# Patient Record
Sex: Female | Born: 1955 | Race: White | Hispanic: No | Marital: Married | Smoking: Never smoker
Health system: Southern US, Community
[De-identification: ages and names within clinical notes are randomized; demographics above are authoritative.]

## PROBLEM LIST (undated history)

## (undated) DIAGNOSIS — Z923 Personal history of irradiation: Secondary | ICD-10-CM

## (undated) DIAGNOSIS — C50919 Malignant neoplasm of unspecified site of unspecified female breast: Secondary | ICD-10-CM

## (undated) DIAGNOSIS — F419 Anxiety disorder, unspecified: Secondary | ICD-10-CM

## (undated) HISTORY — DX: Anxiety disorder, unspecified: F41.9

## (undated) HISTORY — PX: BREAST LUMPECTOMY: SHX2

## (undated) HISTORY — DX: Malignant neoplasm of unspecified site of unspecified female breast: C50.919

---
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
## 1997-10-16 ENCOUNTER — Other Ambulatory Visit: Admission: RE | Admit: 1997-10-16 | Discharge: 1997-10-16 | Payer: Self-pay | Admitting: Gynecology

## 1998-07-06 ENCOUNTER — Other Ambulatory Visit: Admission: RE | Admit: 1998-07-06 | Discharge: 1998-07-06 | Payer: Self-pay | Admitting: Gynecology

## 1998-11-02 ENCOUNTER — Other Ambulatory Visit: Admission: RE | Admit: 1998-11-02 | Discharge: 1998-11-02 | Payer: Self-pay | Admitting: Gynecology

## 1998-12-31 ENCOUNTER — Other Ambulatory Visit: Admission: RE | Admit: 1998-12-31 | Discharge: 1998-12-31 | Payer: Self-pay | Admitting: Gynecology

## 1998-12-31 ENCOUNTER — Encounter (INDEPENDENT_AMBULATORY_CARE_PROVIDER_SITE_OTHER): Payer: Self-pay

## 1999-01-28 ENCOUNTER — Encounter (INDEPENDENT_AMBULATORY_CARE_PROVIDER_SITE_OTHER): Payer: Self-pay | Admitting: Specialist

## 1999-01-28 ENCOUNTER — Ambulatory Visit (HOSPITAL_COMMUNITY): Admission: RE | Admit: 1999-01-28 | Discharge: 1999-01-28 | Payer: Self-pay | Admitting: Gynecology

## 1999-03-23 ENCOUNTER — Other Ambulatory Visit: Admission: RE | Admit: 1999-03-23 | Discharge: 1999-03-23 | Payer: Self-pay | Admitting: Gynecology

## 1999-05-31 ENCOUNTER — Other Ambulatory Visit: Admission: RE | Admit: 1999-05-31 | Discharge: 1999-05-31 | Payer: Self-pay | Admitting: Gynecology

## 1999-11-30 ENCOUNTER — Encounter: Payer: Self-pay | Admitting: Gynecology

## 1999-11-30 ENCOUNTER — Encounter: Admission: RE | Admit: 1999-11-30 | Discharge: 1999-11-30 | Payer: Self-pay | Admitting: Gynecology

## 1999-11-30 ENCOUNTER — Other Ambulatory Visit: Admission: RE | Admit: 1999-11-30 | Discharge: 1999-11-30 | Payer: Self-pay | Admitting: Gynecology

## 2000-07-10 ENCOUNTER — Other Ambulatory Visit: Admission: RE | Admit: 2000-07-10 | Discharge: 2000-07-10 | Payer: Self-pay | Admitting: Gynecology

## 2001-01-18 ENCOUNTER — Other Ambulatory Visit: Admission: RE | Admit: 2001-01-18 | Discharge: 2001-01-18 | Payer: Self-pay | Admitting: Gynecology

## 2001-02-05 ENCOUNTER — Encounter (INDEPENDENT_AMBULATORY_CARE_PROVIDER_SITE_OTHER): Payer: Self-pay | Admitting: Specialist

## 2001-02-05 ENCOUNTER — Other Ambulatory Visit: Admission: RE | Admit: 2001-02-05 | Discharge: 2001-02-05 | Payer: Self-pay | Admitting: Gynecology

## 2001-05-09 ENCOUNTER — Other Ambulatory Visit: Admission: RE | Admit: 2001-05-09 | Discharge: 2001-05-09 | Payer: Self-pay | Admitting: Gynecology

## 2001-11-06 ENCOUNTER — Other Ambulatory Visit: Admission: RE | Admit: 2001-11-06 | Discharge: 2001-11-06 | Payer: Self-pay | Admitting: Gynecology

## 2001-12-31 ENCOUNTER — Encounter: Admission: RE | Admit: 2001-12-31 | Discharge: 2001-12-31 | Payer: Self-pay | Admitting: Gynecology

## 2001-12-31 ENCOUNTER — Encounter: Payer: Self-pay | Admitting: Gynecology

## 2002-01-03 ENCOUNTER — Encounter: Admission: RE | Admit: 2002-01-03 | Discharge: 2002-01-03 | Payer: Self-pay | Admitting: Gynecology

## 2002-01-03 ENCOUNTER — Encounter: Payer: Self-pay | Admitting: Gynecology

## 2002-05-21 ENCOUNTER — Other Ambulatory Visit: Admission: RE | Admit: 2002-05-21 | Discharge: 2002-05-21 | Payer: Self-pay | Admitting: Gynecology

## 2003-01-06 ENCOUNTER — Encounter: Payer: Self-pay | Admitting: Gynecology

## 2003-01-06 ENCOUNTER — Encounter: Admission: RE | Admit: 2003-01-06 | Discharge: 2003-01-06 | Payer: Self-pay | Admitting: Gynecology

## 2003-01-06 ENCOUNTER — Other Ambulatory Visit: Admission: RE | Admit: 2003-01-06 | Discharge: 2003-01-06 | Payer: Self-pay | Admitting: Gynecology

## 2003-01-09 ENCOUNTER — Encounter (INDEPENDENT_AMBULATORY_CARE_PROVIDER_SITE_OTHER): Payer: Self-pay | Admitting: *Deleted

## 2003-01-09 ENCOUNTER — Encounter: Payer: Self-pay | Admitting: Gynecology

## 2003-01-09 ENCOUNTER — Encounter: Admission: RE | Admit: 2003-01-09 | Discharge: 2003-01-09 | Payer: Self-pay | Admitting: Gynecology

## 2003-01-15 ENCOUNTER — Encounter: Payer: Self-pay | Admitting: General Surgery

## 2003-01-15 ENCOUNTER — Ambulatory Visit (HOSPITAL_COMMUNITY): Admission: RE | Admit: 2003-01-15 | Discharge: 2003-01-15 | Payer: Self-pay | Admitting: General Surgery

## 2003-01-16 ENCOUNTER — Encounter: Admission: RE | Admit: 2003-01-16 | Discharge: 2003-01-16 | Payer: Self-pay | Admitting: General Surgery

## 2003-01-16 ENCOUNTER — Ambulatory Visit (HOSPITAL_COMMUNITY): Admission: RE | Admit: 2003-01-16 | Discharge: 2003-01-16 | Payer: Self-pay | Admitting: General Surgery

## 2003-01-16 ENCOUNTER — Encounter: Payer: Self-pay | Admitting: General Surgery

## 2003-01-21 ENCOUNTER — Ambulatory Visit (HOSPITAL_BASED_OUTPATIENT_CLINIC_OR_DEPARTMENT_OTHER): Admission: RE | Admit: 2003-01-21 | Discharge: 2003-01-21 | Payer: Self-pay | Admitting: General Surgery

## 2003-01-21 ENCOUNTER — Encounter: Admission: RE | Admit: 2003-01-21 | Discharge: 2003-01-21 | Payer: Self-pay | Admitting: General Surgery

## 2003-01-21 ENCOUNTER — Encounter: Payer: Self-pay | Admitting: General Surgery

## 2003-01-21 ENCOUNTER — Encounter (INDEPENDENT_AMBULATORY_CARE_PROVIDER_SITE_OTHER): Payer: Self-pay | Admitting: Specialist

## 2003-01-28 ENCOUNTER — Ambulatory Visit: Admission: RE | Admit: 2003-01-28 | Discharge: 2003-04-25 | Payer: Self-pay | Admitting: Radiation Oncology

## 2003-05-23 ENCOUNTER — Ambulatory Visit: Admission: RE | Admit: 2003-05-23 | Discharge: 2003-05-23 | Payer: Self-pay | Admitting: Radiation Oncology

## 2003-07-07 ENCOUNTER — Ambulatory Visit (HOSPITAL_COMMUNITY): Admission: RE | Admit: 2003-07-07 | Discharge: 2003-07-07 | Payer: Self-pay | Admitting: Oncology

## 2003-09-04 ENCOUNTER — Encounter: Admission: RE | Admit: 2003-09-04 | Discharge: 2003-09-04 | Payer: Self-pay | Admitting: General Surgery

## 2003-10-21 ENCOUNTER — Ambulatory Visit: Admission: RE | Admit: 2003-10-21 | Discharge: 2003-10-21 | Payer: Self-pay

## 2003-11-21 ENCOUNTER — Ambulatory Visit (HOSPITAL_COMMUNITY): Admission: RE | Admit: 2003-11-21 | Discharge: 2003-11-21 | Payer: Self-pay | Admitting: Oncology

## 2004-01-07 ENCOUNTER — Other Ambulatory Visit: Admission: RE | Admit: 2004-01-07 | Discharge: 2004-01-07 | Payer: Self-pay | Admitting: Gynecology

## 2004-01-07 ENCOUNTER — Encounter: Admission: RE | Admit: 2004-01-07 | Discharge: 2004-01-07 | Payer: Self-pay | Admitting: Gynecology

## 2004-05-21 ENCOUNTER — Ambulatory Visit: Payer: Self-pay | Admitting: Oncology

## 2004-06-15 ENCOUNTER — Other Ambulatory Visit: Admission: RE | Admit: 2004-06-15 | Discharge: 2004-06-15 | Payer: Self-pay | Admitting: Gynecology

## 2004-11-18 ENCOUNTER — Ambulatory Visit: Payer: Self-pay | Admitting: Oncology

## 2004-11-19 ENCOUNTER — Ambulatory Visit (HOSPITAL_COMMUNITY): Admission: RE | Admit: 2004-11-19 | Discharge: 2004-11-19 | Payer: Self-pay | Admitting: Oncology

## 2004-12-30 ENCOUNTER — Other Ambulatory Visit: Admission: RE | Admit: 2004-12-30 | Discharge: 2004-12-30 | Payer: Self-pay | Admitting: Gynecology

## 2005-01-19 ENCOUNTER — Encounter: Admission: RE | Admit: 2005-01-19 | Discharge: 2005-01-19 | Payer: Self-pay | Admitting: Gynecology

## 2005-04-22 ENCOUNTER — Encounter: Admission: RE | Admit: 2005-04-22 | Discharge: 2005-04-22 | Payer: Self-pay | Admitting: Gynecology

## 2005-05-26 ENCOUNTER — Ambulatory Visit: Payer: Self-pay | Admitting: Oncology

## 2005-07-19 ENCOUNTER — Other Ambulatory Visit: Admission: RE | Admit: 2005-07-19 | Discharge: 2005-07-19 | Payer: Self-pay | Admitting: Gynecology

## 2005-11-23 ENCOUNTER — Ambulatory Visit: Payer: Self-pay | Admitting: Oncology

## 2005-11-25 ENCOUNTER — Ambulatory Visit (HOSPITAL_COMMUNITY): Admission: RE | Admit: 2005-11-25 | Discharge: 2005-11-25 | Payer: Self-pay | Admitting: Oncology

## 2005-11-25 LAB — COMPREHENSIVE METABOLIC PANEL
ALT: 9 U/L (ref 0–40)
AST: 15 U/L (ref 0–37)
BUN: 10 mg/dL (ref 6–23)
Calcium: 9 mg/dL (ref 8.4–10.5)
Chloride: 103 mEq/L (ref 96–112)
Creatinine, Ser: 0.9 mg/dL (ref 0.4–1.2)
Total Bilirubin: 0.5 mg/dL (ref 0.3–1.2)

## 2005-11-25 LAB — LACTATE DEHYDROGENASE: LDH: 142 U/L (ref 94–250)

## 2005-11-25 LAB — CBC WITH DIFFERENTIAL/PLATELET
BASO%: 0.6 % (ref 0.0–2.0)
Basophils Absolute: 0 10*3/uL (ref 0.0–0.1)
EOS%: 1.7 % (ref 0.0–7.0)
HCT: 41.3 % (ref 34.8–46.6)
HGB: 14.1 g/dL (ref 11.6–15.9)
LYMPH%: 23.4 % (ref 14.0–48.0)
MCH: 29.9 pg (ref 26.0–34.0)
MCHC: 34.1 g/dL (ref 32.0–36.0)
MCV: 87.7 fL (ref 81.0–101.0)
NEUT%: 66.1 % (ref 39.6–76.8)
Platelets: 327 10*3/uL (ref 145–400)

## 2006-01-27 ENCOUNTER — Encounter: Admission: RE | Admit: 2006-01-27 | Discharge: 2006-01-27 | Payer: Self-pay | Admitting: Gynecology

## 2006-02-07 ENCOUNTER — Other Ambulatory Visit: Admission: RE | Admit: 2006-02-07 | Discharge: 2006-02-07 | Payer: Self-pay | Admitting: Gynecology

## 2006-05-24 ENCOUNTER — Ambulatory Visit: Payer: Self-pay | Admitting: Oncology

## 2006-05-26 LAB — COMPREHENSIVE METABOLIC PANEL
AST: 13 U/L (ref 0–37)
Alkaline Phosphatase: 48 U/L (ref 39–117)
BUN: 14 mg/dL (ref 6–23)
Creatinine, Ser: 0.9 mg/dL (ref 0.40–1.20)
Potassium: 3.9 mEq/L (ref 3.5–5.3)

## 2006-05-26 LAB — CBC WITH DIFFERENTIAL/PLATELET
Basophils Absolute: 0 10*3/uL (ref 0.0–0.1)
EOS%: 1.5 % (ref 0.0–7.0)
HGB: 14.1 g/dL (ref 11.6–15.9)
MCH: 30.3 pg (ref 26.0–34.0)
MCV: 88.7 fL (ref 81.0–101.0)
MONO%: 7.4 % (ref 0.0–13.0)
NEUT%: 67.3 % (ref 39.6–76.8)
RDW: 13.2 % (ref 11.3–14.5)

## 2006-11-22 ENCOUNTER — Ambulatory Visit: Payer: Self-pay | Admitting: Oncology

## 2006-11-24 ENCOUNTER — Ambulatory Visit (HOSPITAL_COMMUNITY): Admission: RE | Admit: 2006-11-24 | Discharge: 2006-11-24 | Payer: Self-pay | Admitting: Oncology

## 2006-11-24 LAB — COMPREHENSIVE METABOLIC PANEL
Albumin: 4.4 g/dL (ref 3.5–5.2)
BUN: 13 mg/dL (ref 6–23)
Calcium: 9.2 mg/dL (ref 8.4–10.5)
Chloride: 102 mEq/L (ref 96–112)
Creatinine, Ser: 0.85 mg/dL (ref 0.40–1.20)
Glucose, Bld: 91 mg/dL (ref 70–99)
Potassium: 3.9 mEq/L (ref 3.5–5.3)

## 2006-11-24 LAB — CBC WITH DIFFERENTIAL/PLATELET
Basophils Absolute: 0 10*3/uL (ref 0.0–0.1)
EOS%: 1.8 % (ref 0.0–7.0)
Eosinophils Absolute: 0.1 10*3/uL (ref 0.0–0.5)
HCT: 40.2 % (ref 34.8–46.6)
HGB: 14.1 g/dL (ref 11.6–15.9)
MCH: 30.2 pg (ref 26.0–34.0)
MCV: 86.4 fL (ref 81.0–101.0)
NEUT#: 4.8 10*3/uL (ref 1.5–6.5)
NEUT%: 62.3 % (ref 39.6–76.8)
RDW: 12.8 % (ref 11.3–14.5)
lymph#: 2.1 10*3/uL (ref 0.9–3.3)

## 2006-11-24 LAB — LACTATE DEHYDROGENASE: LDH: 145 U/L (ref 94–250)

## 2007-02-19 ENCOUNTER — Other Ambulatory Visit: Admission: RE | Admit: 2007-02-19 | Discharge: 2007-02-19 | Payer: Self-pay | Admitting: Gynecology

## 2007-03-09 ENCOUNTER — Encounter: Admission: RE | Admit: 2007-03-09 | Discharge: 2007-03-09 | Payer: Self-pay | Admitting: Gynecology

## 2007-05-23 ENCOUNTER — Ambulatory Visit: Payer: Self-pay | Admitting: Oncology

## 2007-05-25 LAB — CBC WITH DIFFERENTIAL/PLATELET
Basophils Absolute: 0 10*3/uL (ref 0.0–0.1)
Eosinophils Absolute: 0.1 10*3/uL (ref 0.0–0.5)
HCT: 39.8 % (ref 34.8–46.6)
HGB: 14 g/dL (ref 11.6–15.9)
MCH: 30.9 pg (ref 26.0–34.0)
MONO#: 0.5 10*3/uL (ref 0.1–0.9)
NEUT%: 63.9 % (ref 39.6–76.8)
WBC: 6.1 10*3/uL (ref 3.9–10.0)
lymph#: 1.6 10*3/uL (ref 0.9–3.3)

## 2007-05-25 LAB — COMPREHENSIVE METABOLIC PANEL
BUN: 10 mg/dL (ref 6–23)
CO2: 25 mEq/L (ref 19–32)
Calcium: 9.2 mg/dL (ref 8.4–10.5)
Chloride: 104 mEq/L (ref 96–112)
Creatinine, Ser: 0.82 mg/dL (ref 0.40–1.20)
Glucose, Bld: 83 mg/dL (ref 70–99)

## 2007-05-25 LAB — LACTATE DEHYDROGENASE: LDH: 147 U/L (ref 94–250)

## 2007-05-31 ENCOUNTER — Ambulatory Visit (HOSPITAL_COMMUNITY): Admission: RE | Admit: 2007-05-31 | Discharge: 2007-05-31 | Payer: Self-pay | Admitting: Oncology

## 2007-11-20 ENCOUNTER — Ambulatory Visit: Payer: Self-pay | Admitting: Oncology

## 2007-12-28 LAB — CBC WITH DIFFERENTIAL/PLATELET
Basophils Absolute: 0 10*3/uL (ref 0.0–0.1)
EOS%: 2.3 % (ref 0.0–7.0)
HCT: 40.8 % (ref 34.8–46.6)
HGB: 14.1 g/dL (ref 11.6–15.9)
MCH: 30.3 pg (ref 26.0–34.0)
NEUT%: 65.1 % (ref 39.6–76.8)
lymph#: 1.8 10*3/uL (ref 0.9–3.3)

## 2007-12-28 LAB — COMPREHENSIVE METABOLIC PANEL
ALT: 16 U/L (ref 0–35)
AST: 18 U/L (ref 0–37)
Albumin: 4.7 g/dL (ref 3.5–5.2)
Calcium: 9.4 mg/dL (ref 8.4–10.5)
Chloride: 105 mEq/L (ref 96–112)
Creatinine, Ser: 1 mg/dL (ref 0.40–1.20)
Potassium: 4.2 mEq/L (ref 3.5–5.3)
Sodium: 141 mEq/L (ref 135–145)
Total Protein: 7.4 g/dL (ref 6.0–8.3)

## 2008-01-03 ENCOUNTER — Ambulatory Visit (HOSPITAL_COMMUNITY): Admission: RE | Admit: 2008-01-03 | Discharge: 2008-01-03 | Payer: Self-pay | Admitting: Oncology

## 2008-03-28 ENCOUNTER — Encounter: Admission: RE | Admit: 2008-03-28 | Discharge: 2008-03-28 | Payer: Self-pay | Admitting: Gynecology

## 2008-06-26 ENCOUNTER — Ambulatory Visit: Payer: Self-pay | Admitting: Oncology

## 2008-06-30 LAB — CBC WITH DIFFERENTIAL/PLATELET
BASO%: 0.5 % (ref 0.0–2.0)
EOS%: 2.5 % (ref 0.0–7.0)
HCT: 40 % (ref 34.8–46.6)
LYMPH%: 24.9 % (ref 14.0–48.0)
MCH: 30.9 pg (ref 26.0–34.0)
MCHC: 34.2 g/dL (ref 32.0–36.0)
MCV: 90.5 fL (ref 81.0–101.0)
MONO%: 7.5 % (ref 0.0–13.0)
NEUT%: 64.6 % (ref 39.6–76.8)
Platelets: 289 10*3/uL (ref 145–400)

## 2008-06-30 LAB — COMPREHENSIVE METABOLIC PANEL
ALT: <8 U/L (ref 0–35)
AST: 10 U/L (ref 0–37)
Alkaline Phosphatase: 49 U/L (ref 39–117)
CO2: 23 mEq/L (ref 19–32)
Creatinine, Ser: 0.83 mg/dL (ref 0.40–1.20)
Total Bilirubin: 0.4 mg/dL (ref 0.3–1.2)

## 2008-06-30 LAB — LACTATE DEHYDROGENASE: LDH: 155 U/L (ref 94–250)

## 2009-04-30 ENCOUNTER — Encounter: Admission: RE | Admit: 2009-04-30 | Discharge: 2009-04-30 | Payer: Self-pay | Admitting: Gynecology

## 2009-05-21 ENCOUNTER — Ambulatory Visit: Payer: Self-pay | Admitting: Oncology

## 2009-08-05 ENCOUNTER — Ambulatory Visit: Payer: Self-pay | Admitting: Oncology

## 2009-08-07 LAB — CBC WITH DIFFERENTIAL/PLATELET
Basophils Absolute: 0 10*3/uL (ref 0.0–0.1)
Eosinophils Absolute: 0.1 10*3/uL (ref 0.0–0.5)
HCT: 40.6 % (ref 34.8–46.6)
LYMPH%: 23.1 % (ref 14.0–49.7)
MCV: 90.8 fL (ref 79.5–101.0)
MONO#: 0.6 10*3/uL (ref 0.1–0.9)
MONO%: 7.6 % (ref 0.0–14.0)
NEUT#: 5.7 10*3/uL (ref 1.5–6.5)
NEUT%: 67.3 % (ref 38.4–76.8)
Platelets: 314 10*3/uL (ref 145–400)
RBC: 4.47 10*6/uL (ref 3.70–5.45)
WBC: 8.4 10*3/uL (ref 3.9–10.3)

## 2009-08-07 LAB — COMPREHENSIVE METABOLIC PANEL
Alkaline Phosphatase: 53 U/L (ref 39–117)
BUN: 10 mg/dL (ref 6–23)
CO2: 25 mEq/L (ref 19–32)
Creatinine, Ser: 0.91 mg/dL (ref 0.40–1.20)
Glucose, Bld: 83 mg/dL (ref 70–99)
Sodium: 138 mEq/L (ref 135–145)
Total Bilirubin: 0.6 mg/dL (ref 0.3–1.2)
Total Protein: 7.2 g/dL (ref 6.0–8.3)

## 2009-08-07 LAB — LACTATE DEHYDROGENASE: LDH: 166 U/L (ref 94–250)

## 2010-05-04 ENCOUNTER — Encounter: Admission: RE | Admit: 2010-05-04 | Discharge: 2010-05-04 | Payer: Self-pay | Admitting: Oncology

## 2010-08-04 ENCOUNTER — Ambulatory Visit: Payer: Self-pay | Admitting: Oncology

## 2010-08-06 ENCOUNTER — Ambulatory Visit (HOSPITAL_COMMUNITY)
Admission: RE | Admit: 2010-08-06 | Discharge: 2010-08-06 | Payer: Self-pay | Source: Home / Self Care | Attending: Oncology | Admitting: Oncology

## 2010-08-06 LAB — CBC WITH DIFFERENTIAL/PLATELET
Basophils Absolute: 0 10*3/uL (ref 0.0–0.1)
EOS%: 1.7 % (ref 0.0–7.0)
LYMPH%: 28.2 % (ref 14.0–49.7)
MCH: 30.7 pg (ref 25.1–34.0)
MCV: 89.4 fL (ref 79.5–101.0)
MONO%: 7.6 % (ref 0.0–14.0)
Platelets: 298 10*3/uL (ref 145–400)
RBC: 4.66 10*6/uL (ref 3.70–5.45)
RDW: 13.2 % (ref 11.2–14.5)

## 2010-08-06 LAB — COMPREHENSIVE METABOLIC PANEL
AST: 18 U/L (ref 0–37)
Albumin: 4.6 g/dL (ref 3.5–5.2)
Alkaline Phosphatase: 54 U/L (ref 39–117)
BUN: 15 mg/dL (ref 6–23)
Potassium: 3.9 mEq/L (ref 3.5–5.3)
Sodium: 140 mEq/L (ref 135–145)
Total Bilirubin: 0.5 mg/dL (ref 0.3–1.2)

## 2010-09-02 ENCOUNTER — Other Ambulatory Visit: Payer: Self-pay | Admitting: Gynecology

## 2010-11-26 NOTE — Op Note (Signed)
NAME:  Caitlin Buchanan, Caitlin Buchanan                        ACCOUNT NO.:  [id number]   MEDICAL RECORD NO.:  [id number]                   PATIENT TYPE:  OUT   LOCATION:  MRI                                  FACILITY:  [facility]   PHYSICIAN:  Rose Phi. Maple Hudson, M.D.                DATE OF BIRTH:  Apr 23, 1956   DATE OF PROCEDURE:  01/21/2003  DATE OF DISCHARGE:                                 OPERATIVE REPORT   PREOPERATIVE DIAGNOSIS:  Stage I carcinoma of the right breast.   POSTOPERATIVE DIAGNOSIS:  Stage I carcinoma of the right breast.   OPERATION PERFORMED:  1. Blue dye injection.  2. Right partial mastectomy with needle localization and specimen     mammography.  3. Right sentinel lymph node biopsy.   SURGEON:  Rose Phi. Maple Hudson, M.D.   ANESTHESIA:  General.   DESCRIPTION OF PROCEDURE:  After suitable general anesthesia was induced,  the patient was placed in supine position with the right arm extended on the  arm board.  Prior to coming to the operating room, 1 mCi of technetium  sulfur colloid was injected intradermally.  After anesthetic had been  induced, I injected a mixture of 2mL of methylene blue and 3mL of injectable  saline in subareolar tissue.   We then prepped and draped the breast.  The nodule that was malignant was in  the 3 o'clock position of her right breast. A curved incision was then made  using the previously placed wire as a guide and a wide excision of the wire  and surrounding tissue was carried out.  The specimen was then oriented for  the pathologist with clips.  Ultrasound showed it to be in the lesion and  Touch Prep ultimately showed the margins to be clean.   While that was being done, a short incision was made in the right axilla  with dissection down through the subcutaneous tissue to the clavipectoral  fascia.  Just deep to that fascia was a blue and hot lymph nodes with counts  in excess of 2000.  I excised the lymph node.  Clips were used for the  lymphatics.  That was then submitted for Touch Preps.  There were no other  hot, blue or palpable nodes.  The incisions were then closed with 3-0 Vicryl  and subcuticular 4-0 Monocryl and Steri-Strips.   Touch Preps on the margins were clean and the sentinel node was negative.  Dressings were applied.  The patient was then transferred to the recovery  room in satisfactory condition having tolerated the procedure well.                                               Rose Phi. Maple Hudson, M.D.    PRY/MEDQ  D:  01/21/2003  T:  01/21/2003  Job:  [id number]   cc:   [name], M.D.  [address] [place]  [place] [address]  Fax: [phone]

## 2011-04-26 ENCOUNTER — Other Ambulatory Visit: Payer: Self-pay | Admitting: Gynecology

## 2011-04-26 DIAGNOSIS — Z1231 Encounter for screening mammogram for malignant neoplasm of breast: Secondary | ICD-10-CM

## 2011-05-12 ENCOUNTER — Ambulatory Visit: Payer: Self-pay

## 2011-05-24 ENCOUNTER — Other Ambulatory Visit: Payer: Self-pay | Admitting: Gynecology

## 2011-06-13 ENCOUNTER — Other Ambulatory Visit: Payer: Self-pay | Admitting: Gynecology

## 2011-06-13 ENCOUNTER — Ambulatory Visit
Admission: RE | Admit: 2011-06-13 | Discharge: 2011-06-13 | Disposition: A | Discharge status: Home / Self Care | Payer: BC Managed Care – PPO | Source: Ambulatory Visit | Attending: Gynecology | Admitting: Gynecology

## 2011-06-13 DIAGNOSIS — N644 Mastodynia: Secondary | ICD-10-CM

## 2011-06-13 DIAGNOSIS — Z1231 Encounter for screening mammogram for malignant neoplasm of breast: Secondary | ICD-10-CM

## 2011-06-13 DIAGNOSIS — N6310 Unspecified lump in the right breast, unspecified quadrant: Secondary | ICD-10-CM

## 2011-06-27 ENCOUNTER — Ambulatory Visit
Admission: RE | Admit: 2011-06-27 | Discharge: 2011-06-27 | Disposition: A | Discharge status: Home / Self Care | Payer: BC Managed Care – PPO | Source: Ambulatory Visit | Attending: Gynecology | Admitting: Gynecology

## 2011-06-27 DIAGNOSIS — N644 Mastodynia: Secondary | ICD-10-CM

## 2011-06-27 DIAGNOSIS — N6310 Unspecified lump in the right breast, unspecified quadrant: Secondary | ICD-10-CM

## 2011-08-09 ENCOUNTER — Telehealth: Payer: Self-pay | Admitting: Oncology

## 2011-08-09 NOTE — Telephone Encounter (Signed)
pt had called to r/s 1/31appt,r/s to 3/14 and i l/m w/ robin as pt req to have chol ck but she may do this wth her gyn   aom

## 2011-08-11 ENCOUNTER — Ambulatory Visit: Payer: BC Managed Care – PPO | Admitting: Physician Assistant

## 2011-08-11 ENCOUNTER — Other Ambulatory Visit: Payer: BC Managed Care – PPO | Admitting: Lab

## 2011-09-20 ENCOUNTER — Telehealth: Payer: Self-pay | Admitting: Oncology

## 2011-09-20 NOTE — Telephone Encounter (Signed)
R/s 3/14 appt to 3/22 w/RJ due to SW out of office. S/w pt today she is aware of change and new d/t.

## 2011-09-22 ENCOUNTER — Ambulatory Visit: Payer: BC Managed Care – PPO | Admitting: Physician Assistant

## 2011-09-28 ENCOUNTER — Telehealth: Payer: Self-pay | Admitting: Oncology

## 2011-09-28 NOTE — Telephone Encounter (Signed)
pt had called to r/s 3/22 appt,l/m for her to c/b  aom

## 2011-09-30 ENCOUNTER — Ambulatory Visit: Payer: BC Managed Care – PPO | Admitting: Physician Assistant

## 2011-12-26 ENCOUNTER — Telehealth: Payer: Self-pay | Admitting: Oncology

## 2011-12-26 NOTE — Telephone Encounter (Signed)
pt called to r/s appts aom

## 2012-01-05 ENCOUNTER — Other Ambulatory Visit: Payer: Self-pay | Admitting: Gynecology

## 2012-01-30 ENCOUNTER — Other Ambulatory Visit: Payer: Self-pay

## 2012-01-30 DIAGNOSIS — C50919 Malignant neoplasm of unspecified site of unspecified female breast: Secondary | ICD-10-CM

## 2012-01-31 ENCOUNTER — Ambulatory Visit (HOSPITAL_BASED_OUTPATIENT_CLINIC_OR_DEPARTMENT_OTHER): Payer: BC Managed Care – PPO | Admitting: Oncology

## 2012-01-31 ENCOUNTER — Telehealth: Payer: Self-pay | Admitting: Medical Oncology

## 2012-01-31 ENCOUNTER — Encounter: Payer: Self-pay | Admitting: Oncology

## 2012-01-31 ENCOUNTER — Other Ambulatory Visit (HOSPITAL_BASED_OUTPATIENT_CLINIC_OR_DEPARTMENT_OTHER): Payer: BC Managed Care – PPO | Admitting: Lab

## 2012-01-31 VITALS — BP 129/85 | HR 87 | Temp 99.2°F | Ht 68.0 in | Wt 206.0 lb

## 2012-01-31 DIAGNOSIS — C50911 Malignant neoplasm of unspecified site of right female breast: Secondary | ICD-10-CM

## 2012-01-31 DIAGNOSIS — C50919 Malignant neoplasm of unspecified site of unspecified female breast: Secondary | ICD-10-CM

## 2012-01-31 LAB — CBC WITH DIFFERENTIAL/PLATELET
Basophils Absolute: 0 10*3/uL (ref 0.0–0.1)
Eosinophils Absolute: 0.2 10*3/uL (ref 0.0–0.5)
HGB: 13.8 g/dL (ref 11.6–15.9)
MCV: 88.8 fL (ref 79.5–101.0)
MONO#: 0.6 10*3/uL (ref 0.1–0.9)
MONO%: 7.9 % (ref 0.0–14.0)
NEUT#: 4.6 10*3/uL (ref 1.5–6.5)
RBC: 4.66 10*6/uL (ref 3.70–5.45)
RDW: 13.3 % (ref 11.2–14.5)
WBC: 7.4 10*3/uL (ref 3.9–10.3)
lymph#: 2 10*3/uL (ref 0.9–3.3)

## 2012-01-31 LAB — COMPREHENSIVE METABOLIC PANEL
Albumin: 4.3 g/dL (ref 3.5–5.2)
Alkaline Phosphatase: 81 U/L (ref 39–117)
BUN: 15 mg/dL (ref 6–23)
CO2: 29 mEq/L (ref 19–32)
Calcium: 10 mg/dL (ref 8.4–10.5)
Chloride: 102 mEq/L (ref 96–112)
Glucose, Bld: 97 mg/dL (ref 70–99)
Potassium: 3.9 mEq/L (ref 3.5–5.3)
Sodium: 139 mEq/L (ref 135–145)
Total Protein: 7.2 g/dL (ref 6.0–8.3)

## 2012-01-31 NOTE — Progress Notes (Signed)
This office note has been dictated.  #[id number]

## 2012-01-31 NOTE — Progress Notes (Signed)
CC:   Caitlin Buchanan, Ph.D., M.D. Caitlin Buchanan, M.D.  PROBLEM LIST: 1. 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     Estrogen receptor 17%, progesterone receptor 73%.  Negative HER-     2/neu, S-phase fraction 5% which is low, treated with partial     mastectomy, sentinel lymph node dissection on 01/21/2003 and     radiation treatments given from 03/05/2003 through 04/22/2003 under     the direction of Dr. Antony Buchanan, total of 6240 cGy.  The patient     received adjuvant tamoxifen from mid October 2004 through mid May     2006.  Tamoxifen was discontinued because of weight gain and hot     flashes.  The patient has remained off treatment and disease free     since that time. 2. Dyslipidemia diagnosed December 2012. 3. Hypothyroidism diagnosed December 2012. 4. Detached retina of right eye late January 2013 status post     treatment, now resolved. 5. Anxiety since January 2004.  MEDICATIONS: 1. Lipitor 20 mg daily. 2. Clonidine 0.2 mg p.o. at bedtime for hot flashes. 3. Ergocalciferol 1000 mg daily. 4. Lexapro 10 mg daily. 5. Levothyroxine 75 mcg daily. 6. Multivitamins daily. 7. Desyrel 100 mg at bedtime.  HISTORY:  Caitlin Buchanan was seen today for followup of her stage I, grade 1, tubular carcinoma of the right breast with positive hormone receptors and negative HER-2/neu dating back to June of 2004, currently off of all treatment and disease free.  The patient denies any problems related to her breast cancer.  She has remained generally healthy.  She had a chest x-ray on 08/06/2010 at the time of her last visit which was negative.  She has had diagnostic bilateral mammograms and a right breast ultrasound carried out on 06/27/2011 which were negative.  No abnormality was seen at the 7 o'clock position where there was a palpable abnormality felt apparently by the patient and Dr. Nicholas Buchanan.   The patient seems to be doing well at this time.  She tells me that she continues to live in [place].  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  PHYSICAL EXAMINATION:  The patient looks well.  She has gained considerable weight over the years.  Her weight today is 206 pounds as compared with a weight of 192 pounds back in January 2012.  If we go back to July of 2004, the patient weighed about 160 pounds.  Weight gain has been more less gradual over the years.  Height 5 feet 8 inches, body surface area 2.12 sq m.  Blood pressure 129/85.  Other vital signs are normal.  Temperature is 99.2.  There is no scleral icterus.  Mouth and pharynx are benign.  There is no peripheral adenopathy palpable.  No axillary adenopathy.  Heart and lungs are normal.  Left breast is benign.  Right breast is slightly smaller than the left breast with excellent cosmetic result.  There is a curvilinear scar medial to the nipple-areolar complex which is well healed without any suspicious findings.  There was a palpable nodule or irregular tissue at about the 7 o'clock position adjacent to the areola.  Abdomen:  Benign with no organomegaly or masses palpable.  Extremities:  Puffy without pitting edema or clubbing.  No lymphedema of the right arm.  Neurologic:  Exam is normal.  By  history, the patient says that she has been having some swelling of her lower legs and ankles.  That is not evident today.  LABORATORY DATA:  Today, white count 7.4, ANC 4.6, hemoglobin 13.8, hematocrit 41.4, platelets 258,000.  Chemistries today were entirely normal.  BUN 15, creatinine 0.99.  IMAGING STUDIES: 1. Chest x-ray, 2 view, from 08/06/2010 was negative. 2. Digital diagnostic bilateral mammogram and right breast ultrasound     on 06/27/2011 showed no evidence for malignancy in either breast.     Specifically, there was no abnormality seen in the right breast at     the 7 o'clock  position.  IMPRESSION AND PLAN:  Caitlin Buchanan continues to do well with no signs of recurrent breast cancer now 9 years from the time of diagnosis.  The breast cancer had very favorable characteristics.  The patient asked me about using vaginal estrogen cream.  I see no contraindication to the use of vaginal estrogen cream at this point in time, especially if there is appropriate indication.  Clearly, the patient needs to continue with her yearly mammograms.  In looking over her chart, her last colonoscopy was carried out on 06/16/2008.  I believe this was in [facility] and was negative.  The patient tells me that her maternal uncle had colon cancer and is being recommended that her colonoscopy be repeated in the latter part of 2014, at a 5-year interval.  I think this is quite reasonable.  I told the patient that she did not need ongoing followup through our office, that she could clearly be followed by Dr. Nicholas Buchanan who is managing her primary care needs at this time.  Certainly, I would be more than happy to see Caitlin Buchanan again should any questions or problems arise in the future.    ______________________________ Samul Dada, M.D. DSM/MEDQ  D:  01/31/2012  T:  01/31/2012  Job:  [id number]

## 2012-02-02 NOTE — Telephone Encounter (Signed)
Opened in error

## 2012-07-12 ENCOUNTER — Other Ambulatory Visit: Payer: Self-pay | Admitting: Gynecology

## 2012-07-12 DIAGNOSIS — Z1231 Encounter for screening mammogram for malignant neoplasm of breast: Secondary | ICD-10-CM

## 2012-08-01 ENCOUNTER — Ambulatory Visit
Admission: RE | Admit: 2012-08-01 | Discharge: 2012-08-01 | Disposition: A | Discharge status: Home / Self Care | Payer: BC Managed Care – PPO | Source: Ambulatory Visit | Attending: Gynecology | Admitting: Gynecology

## 2012-08-01 DIAGNOSIS — Z1231 Encounter for screening mammogram for malignant neoplasm of breast: Secondary | ICD-10-CM

## 2013-07-22 ENCOUNTER — Other Ambulatory Visit: Payer: Self-pay

## 2013-07-22 DIAGNOSIS — Z1231 Encounter for screening mammogram for malignant neoplasm of breast: Secondary | ICD-10-CM

## 2013-07-22 DIAGNOSIS — Z853 Personal history of malignant neoplasm of breast: Secondary | ICD-10-CM

## 2013-07-22 DIAGNOSIS — Z9889 Other specified postprocedural states: Secondary | ICD-10-CM

## 2013-08-20 ENCOUNTER — Ambulatory Visit: Payer: BC Managed Care – PPO

## 2013-08-26 ENCOUNTER — Ambulatory Visit
Admission: RE | Admit: 2013-08-26 | Discharge: 2013-08-26 | Disposition: A | Discharge status: Home / Self Care | Payer: BC Managed Care – PPO | Source: Ambulatory Visit

## 2013-08-26 DIAGNOSIS — Z9889 Other specified postprocedural states: Secondary | ICD-10-CM

## 2013-08-26 DIAGNOSIS — Z853 Personal history of malignant neoplasm of breast: Secondary | ICD-10-CM

## 2013-08-26 DIAGNOSIS — Z1231 Encounter for screening mammogram for malignant neoplasm of breast: Secondary | ICD-10-CM

## 2013-09-02 ENCOUNTER — Other Ambulatory Visit: Payer: Self-pay | Admitting: Obstetrics and Gynecology

## 2013-09-02 DIAGNOSIS — R928 Other abnormal and inconclusive findings on diagnostic imaging of breast: Secondary | ICD-10-CM

## 2013-09-09 ENCOUNTER — Ambulatory Visit
Admission: RE | Admit: 2013-09-09 | Discharge: 2013-09-09 | Disposition: A | Discharge status: Home / Self Care | Payer: BC Managed Care – PPO | Source: Ambulatory Visit | Attending: Obstetrics and Gynecology | Admitting: Obstetrics and Gynecology

## 2013-09-09 ENCOUNTER — Other Ambulatory Visit: Payer: Self-pay | Admitting: Obstetrics and Gynecology

## 2013-09-09 DIAGNOSIS — R928 Other abnormal and inconclusive findings on diagnostic imaging of breast: Secondary | ICD-10-CM

## 2014-10-28 ENCOUNTER — Other Ambulatory Visit: Payer: Self-pay

## 2014-10-28 DIAGNOSIS — Z1231 Encounter for screening mammogram for malignant neoplasm of breast: Secondary | ICD-10-CM

## 2014-11-12 ENCOUNTER — Ambulatory Visit: Payer: BC Managed Care – PPO

## 2014-11-27 ENCOUNTER — Ambulatory Visit
Admission: RE | Admit: 2014-11-27 | Discharge: 2014-11-27 | Disposition: A | Discharge status: Home / Self Care | Payer: BC Managed Care – PPO | Source: Ambulatory Visit

## 2014-11-27 DIAGNOSIS — Z1231 Encounter for screening mammogram for malignant neoplasm of breast: Secondary | ICD-10-CM

## 2015-12-03 ENCOUNTER — Other Ambulatory Visit: Payer: Self-pay

## 2015-12-03 DIAGNOSIS — Z1231 Encounter for screening mammogram for malignant neoplasm of breast: Secondary | ICD-10-CM

## 2015-12-11 ENCOUNTER — Ambulatory Visit: Payer: BC Managed Care – PPO

## 2015-12-15 ENCOUNTER — Ambulatory Visit
Admission: RE | Admit: 2015-12-15 | Discharge: 2015-12-15 | Disposition: A | Discharge status: Home / Self Care | Payer: BC Managed Care – PPO | Source: Ambulatory Visit

## 2015-12-15 DIAGNOSIS — Z1231 Encounter for screening mammogram for malignant neoplasm of breast: Secondary | ICD-10-CM

## 2015-12-17 ENCOUNTER — Other Ambulatory Visit: Payer: Self-pay | Admitting: Obstetrics and Gynecology

## 2015-12-17 DIAGNOSIS — R928 Other abnormal and inconclusive findings on diagnostic imaging of breast: Secondary | ICD-10-CM

## 2015-12-24 ENCOUNTER — Ambulatory Visit
Admission: RE | Admit: 2015-12-24 | Discharge: 2015-12-24 | Disposition: A | Discharge status: Home / Self Care | Payer: BC Managed Care – PPO | Source: Ambulatory Visit | Attending: Obstetrics and Gynecology | Admitting: Obstetrics and Gynecology

## 2015-12-24 ENCOUNTER — Other Ambulatory Visit: Payer: Self-pay | Admitting: Obstetrics and Gynecology

## 2015-12-24 DIAGNOSIS — R928 Other abnormal and inconclusive findings on diagnostic imaging of breast: Secondary | ICD-10-CM

## 2015-12-24 DIAGNOSIS — N6310 Unspecified lump in the right breast, unspecified quadrant: Secondary | ICD-10-CM

## 2015-12-25 ENCOUNTER — Other Ambulatory Visit: Payer: Self-pay | Admitting: Obstetrics and Gynecology

## 2015-12-25 ENCOUNTER — Ambulatory Visit
Admission: RE | Admit: 2015-12-25 | Discharge: 2015-12-25 | Disposition: A | Discharge status: Home / Self Care | Payer: BC Managed Care – PPO | Source: Ambulatory Visit | Attending: Obstetrics and Gynecology | Admitting: Obstetrics and Gynecology

## 2015-12-25 DIAGNOSIS — N6310 Unspecified lump in the right breast, unspecified quadrant: Secondary | ICD-10-CM

## 2016-12-19 ENCOUNTER — Other Ambulatory Visit: Payer: Self-pay | Admitting: Obstetrics and Gynecology

## 2016-12-19 DIAGNOSIS — Z1231 Encounter for screening mammogram for malignant neoplasm of breast: Secondary | ICD-10-CM

## 2017-01-02 ENCOUNTER — Ambulatory Visit
Admission: RE | Admit: 2017-01-02 | Discharge: 2017-01-02 | Disposition: A | Discharge status: Home / Self Care | Payer: BC Managed Care – PPO | Source: Ambulatory Visit | Attending: Obstetrics and Gynecology | Admitting: Obstetrics and Gynecology

## 2017-01-02 DIAGNOSIS — Z1231 Encounter for screening mammogram for malignant neoplasm of breast: Secondary | ICD-10-CM

## 2017-01-02 HISTORY — DX: Personal history of irradiation: Z92.3

## 2017-11-24 ENCOUNTER — Other Ambulatory Visit: Payer: Self-pay | Admitting: Family Medicine

## 2017-11-24 DIAGNOSIS — Z1231 Encounter for screening mammogram for malignant neoplasm of breast: Secondary | ICD-10-CM

## 2018-01-03 ENCOUNTER — Ambulatory Visit
Admission: RE | Admit: 2018-01-03 | Discharge: 2018-01-03 | Disposition: A | Discharge status: Home / Self Care | Payer: BC Managed Care – PPO | Source: Ambulatory Visit | Attending: Family Medicine | Admitting: Family Medicine

## 2018-01-03 DIAGNOSIS — Z1231 Encounter for screening mammogram for malignant neoplasm of breast: Secondary | ICD-10-CM

## 2018-12-17 ENCOUNTER — Other Ambulatory Visit: Payer: Self-pay | Admitting: Gynecology

## 2018-12-17 DIAGNOSIS — Z1231 Encounter for screening mammogram for malignant neoplasm of breast: Secondary | ICD-10-CM

## 2019-01-31 ENCOUNTER — Ambulatory Visit
Admission: RE | Admit: 2019-01-31 | Discharge: 2019-01-31 | Disposition: A | Discharge status: Home / Self Care | Payer: BC Managed Care – PPO | Source: Ambulatory Visit | Attending: Gynecology | Admitting: Gynecology

## 2019-01-31 ENCOUNTER — Other Ambulatory Visit: Payer: Self-pay

## 2019-01-31 DIAGNOSIS — Z1231 Encounter for screening mammogram for malignant neoplasm of breast: Secondary | ICD-10-CM

## 2019-12-25 ENCOUNTER — Other Ambulatory Visit: Payer: Self-pay | Admitting: Gynecology

## 2019-12-25 DIAGNOSIS — Z1231 Encounter for screening mammogram for malignant neoplasm of breast: Secondary | ICD-10-CM

## 2020-02-03 ENCOUNTER — Other Ambulatory Visit: Payer: Self-pay

## 2020-02-03 ENCOUNTER — Ambulatory Visit
Admission: RE | Admit: 2020-02-03 | Discharge: 2020-02-03 | Disposition: A | Discharge status: Home / Self Care | Payer: BC Managed Care – PPO | Source: Ambulatory Visit | Attending: Gynecology | Admitting: Gynecology

## 2020-02-03 DIAGNOSIS — Z1231 Encounter for screening mammogram for malignant neoplasm of breast: Secondary | ICD-10-CM

## 2020-11-17 ENCOUNTER — Other Ambulatory Visit: Payer: Self-pay | Admitting: Obstetrics and Gynecology

## 2020-11-17 DIAGNOSIS — Z1231 Encounter for screening mammogram for malignant neoplasm of breast: Secondary | ICD-10-CM

## 2021-02-15 ENCOUNTER — Other Ambulatory Visit: Payer: Self-pay

## 2021-02-15 ENCOUNTER — Ambulatory Visit
Admission: RE | Admit: 2021-02-15 | Discharge: 2021-02-15 | Disposition: A | Discharge status: Home / Self Care | Payer: BC Managed Care – PPO | Source: Ambulatory Visit | Attending: Obstetrics and Gynecology | Admitting: Obstetrics and Gynecology

## 2021-02-15 DIAGNOSIS — Z1231 Encounter for screening mammogram for malignant neoplasm of breast: Secondary | ICD-10-CM

## 2022-02-03 ENCOUNTER — Other Ambulatory Visit: Payer: Self-pay | Admitting: Obstetrics and Gynecology

## 2022-02-03 DIAGNOSIS — Z1231 Encounter for screening mammogram for malignant neoplasm of breast: Secondary | ICD-10-CM

## 2022-03-08 ENCOUNTER — Ambulatory Visit
Admission: RE | Admit: 2022-03-08 | Discharge: 2022-03-08 | Disposition: A | Discharge status: Home / Self Care | Payer: BC Managed Care – PPO | Source: Ambulatory Visit | Attending: Obstetrics and Gynecology | Admitting: Obstetrics and Gynecology

## 2022-03-08 DIAGNOSIS — Z1231 Encounter for screening mammogram for malignant neoplasm of breast: Secondary | ICD-10-CM

## 2023-02-01 ENCOUNTER — Other Ambulatory Visit: Payer: Self-pay | Admitting: Gynecology

## 2023-02-01 DIAGNOSIS — Z1231 Encounter for screening mammogram for malignant neoplasm of breast: Secondary | ICD-10-CM

## 2023-03-15 ENCOUNTER — Ambulatory Visit: Payer: BC Managed Care – PPO

## 2023-03-15 ENCOUNTER — Ambulatory Visit
Admission: RE | Admit: 2023-03-15 | Discharge: 2023-03-15 | Disposition: A | Discharge status: Home / Self Care | Payer: Medicare PPO | Source: Ambulatory Visit | Attending: Gynecology | Admitting: Gynecology

## 2023-03-15 DIAGNOSIS — Z1231 Encounter for screening mammogram for malignant neoplasm of breast: Secondary | ICD-10-CM

## 2023-06-12 IMAGING — MG MM DIGITAL SCREENING BILAT W/ TOMO AND CAD
8 series · 9 of 24 positions shown · non-contrast
Comparison: Previous exam(s).

CLINICAL DATA: Screening.

EXAM:
DIGITAL SCREENING BILATERAL MAMMOGRAM WITH TOMOSYNTHESIS AND CAD
TECHNIQUE: Bilateral screening digital craniocaudal and mediolateral oblique
mammograms were obtained. Bilateral screening digital breast
tomosynthesis was performed. The images were evaluated with
computer-aided detection.

[R CC synth-2D]
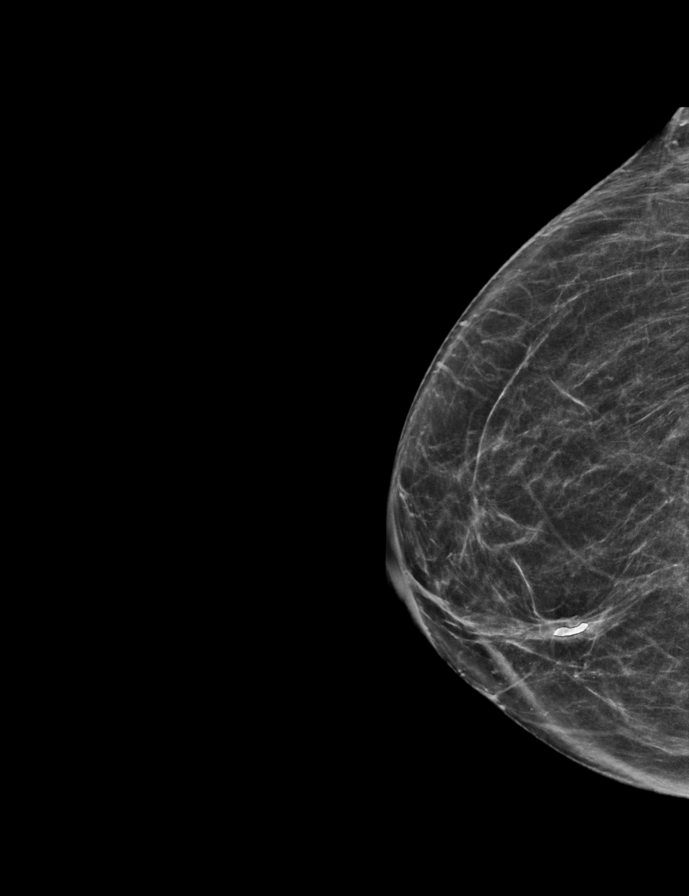

[L CC synth-2D]
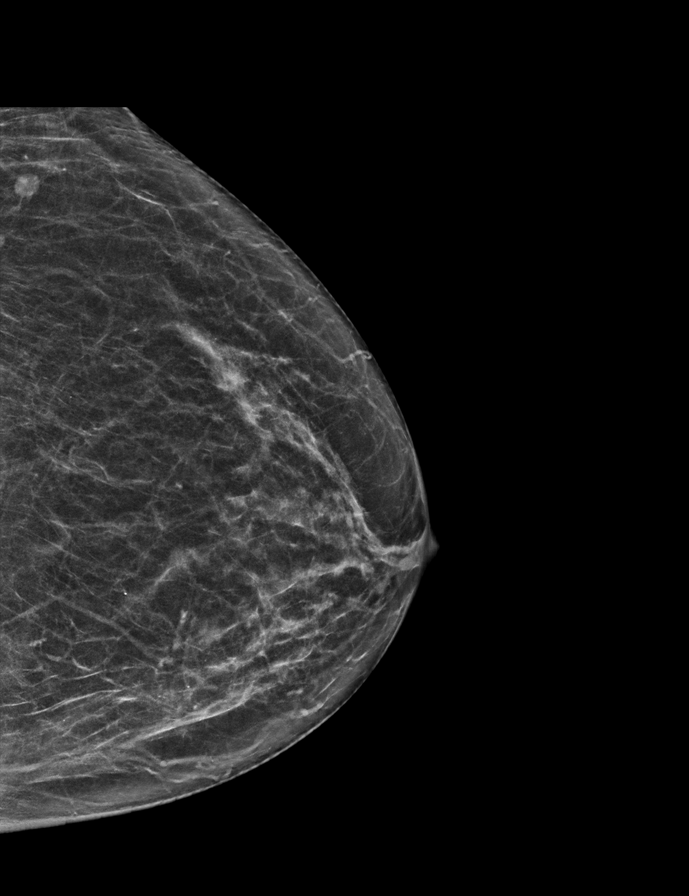

[L MLO synth-2D]
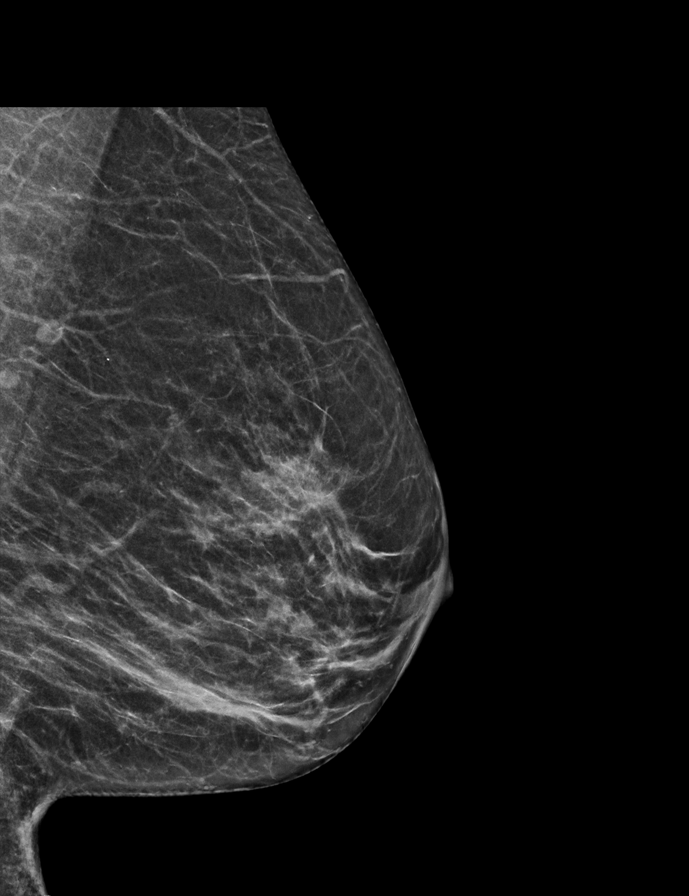

[R MLO synth-2D]
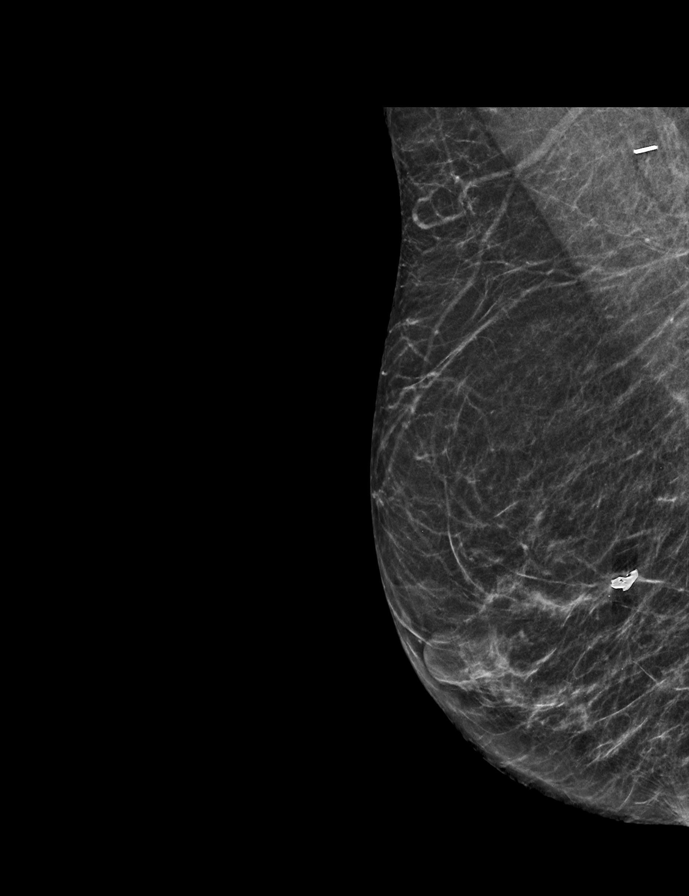

[L CC tomo · 2 of 56 frames shown]
[frame 19/56]
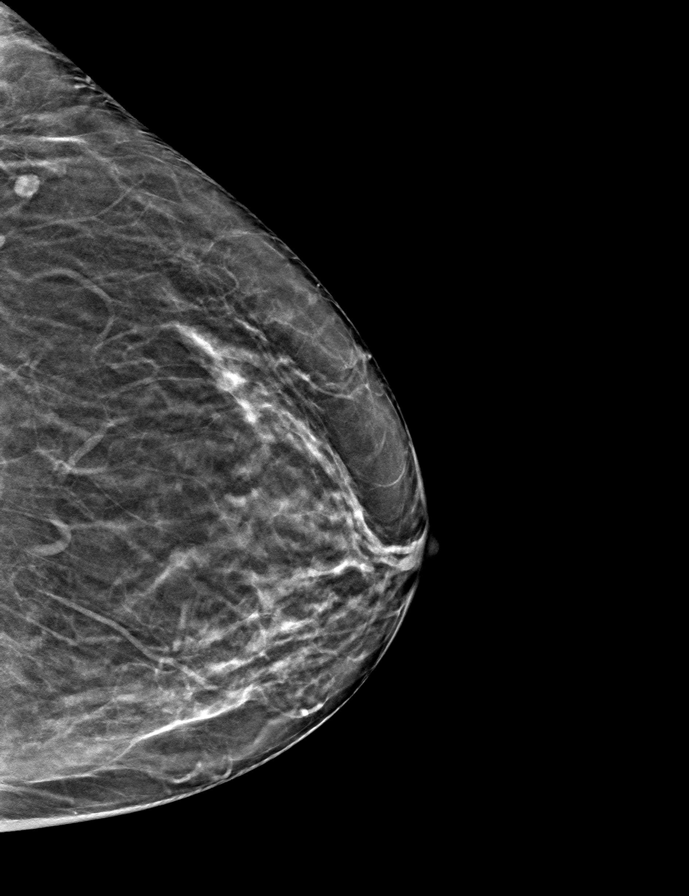
[frame 29/56]
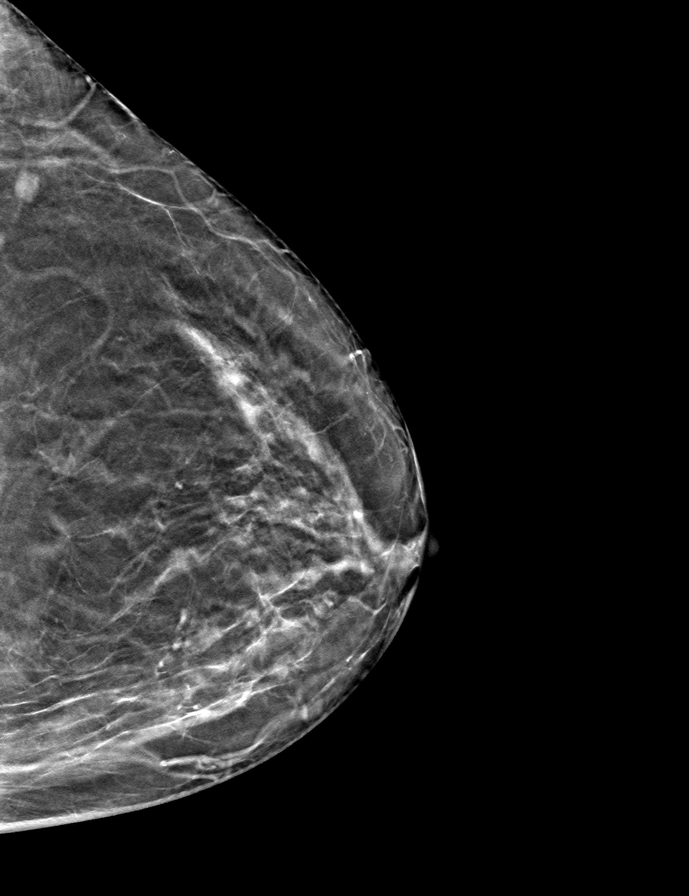

[L MLO tomo · tomo slice 27/52.0]
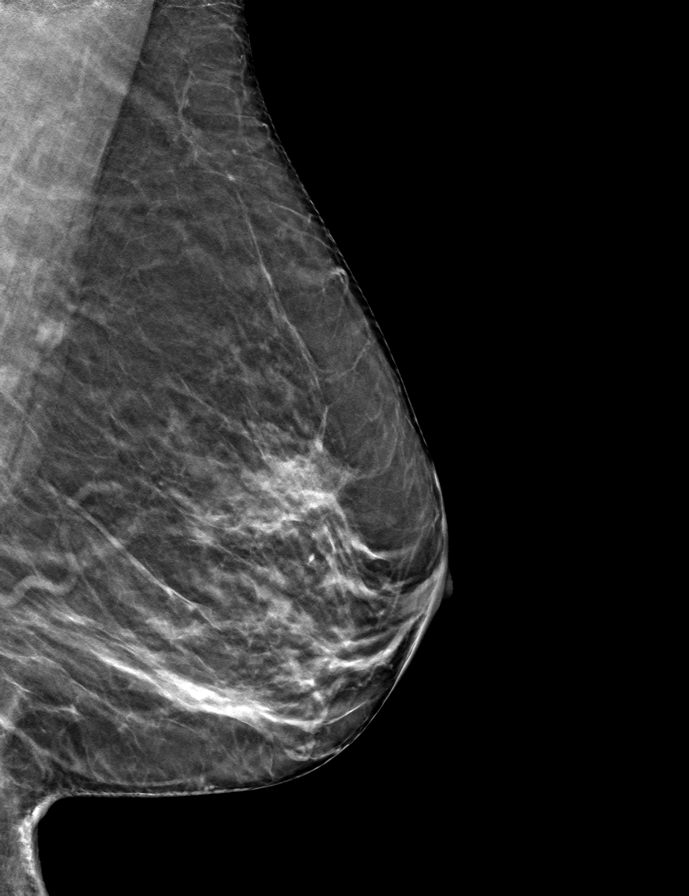

[R MLO tomo · tomo slice 24/47.0]
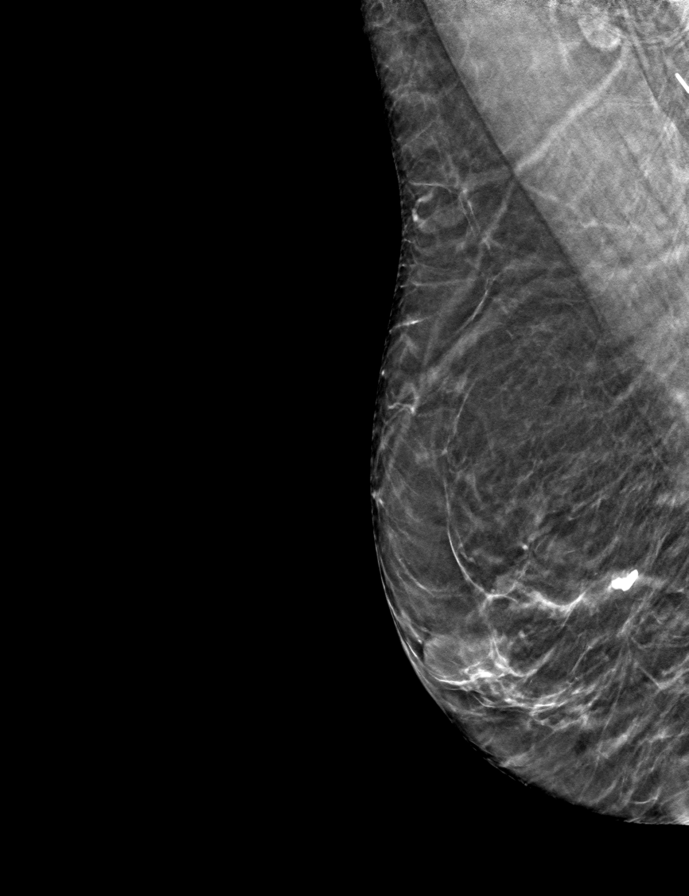

[R CC tomo · tomo slice 27/53.0]
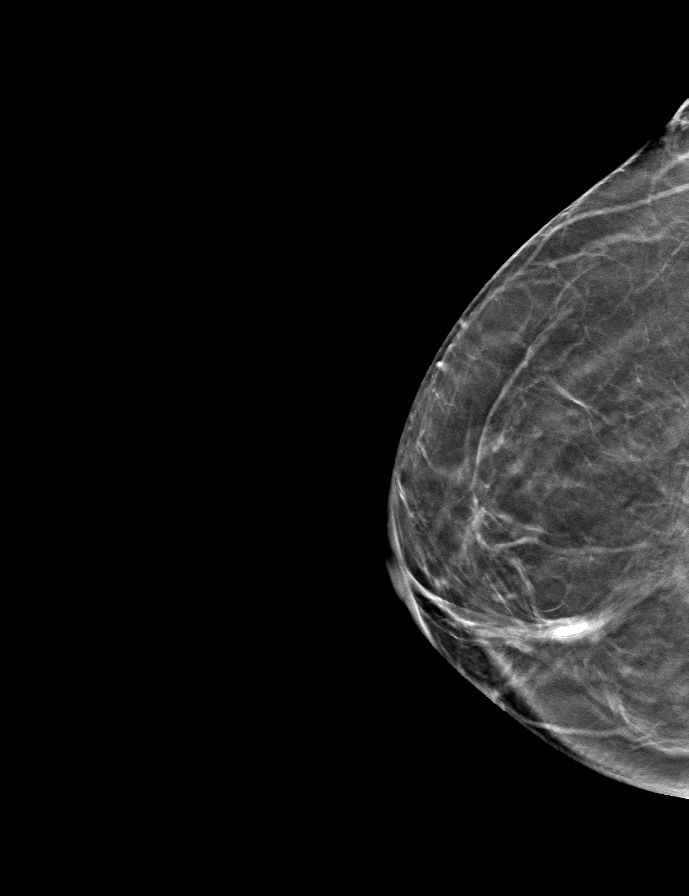

[9 of 24 positions shown; findings below may reference images not displayed]

ACR Breast Density Category b: There are scattered areas of
fibroglandular density.
FINDINGS: There are no findings suspicious for malignancy.
IMPRESSION: No mammographic evidence of malignancy. A result letter of this
screening mammogram will be mailed directly to the patient.

RECOMMENDATION:
Screening mammogram in one year. (Code:51-O-LD2)

BI-RADS CATEGORY  1: Negative.

## 2024-02-07 ENCOUNTER — Other Ambulatory Visit: Payer: Self-pay | Admitting: Gynecology

## 2024-02-07 DIAGNOSIS — Z1231 Encounter for screening mammogram for malignant neoplasm of breast: Secondary | ICD-10-CM

## 2024-03-21 ENCOUNTER — Ambulatory Visit
Admission: RE | Admit: 2024-03-21 | Discharge: 2024-03-21 | Disposition: A | Discharge status: Home / Self Care | Source: Ambulatory Visit | Attending: Gynecology | Admitting: Gynecology

## 2024-03-21 DIAGNOSIS — Z1231 Encounter for screening mammogram for malignant neoplasm of breast: Secondary | ICD-10-CM
